# Patient Record
Sex: Male | Born: 1970 | Race: Black or African American | Hispanic: No | Marital: Married | Smoking: Former smoker
Health system: Southern US, Community
[De-identification: ages and names within clinical notes are randomized; demographics above are authoritative.]

---

## 2006-09-07 ENCOUNTER — Emergency Department: Payer: Self-pay | Admitting: Internal Medicine

## 2008-09-13 ENCOUNTER — Inpatient Hospital Stay: Payer: Self-pay | Admitting: Internal Medicine

## 2010-06-15 ENCOUNTER — Emergency Department: Payer: Self-pay | Admitting: Emergency Medicine

## 2019-04-17 ENCOUNTER — Ambulatory Visit: Payer: Self-pay | Attending: Oncology

## 2019-04-17 DIAGNOSIS — Z23 Encounter for immunization: Secondary | ICD-10-CM

## 2019-04-17 NOTE — Progress Notes (Signed)
   Covid-19 Vaccination Clinic  Name:  Edward Dunlap    MRN: [id number] DOB: 08-23-1970  04/17/2019  [name] was observed post Covid-19 immunization for 15 minutes without incident. He was provided with Vaccine Information Sheet and instruction to access the V-Safe system.   [name] was instructed to call 911 with any severe reactions post vaccine: Marland Kitchen Difficulty breathing  . Swelling of face and throat  . A fast heartbeat  . A bad rash all over body  . Dizziness and weakness   Immunizations Administered    Name Date Dose VIS Date Route   Pfizer COVID-19 Vaccine 04/17/2019  9:32 AM 0.3 mL 12/18/2018 Intramuscular   Manufacturer: [employer], [employer]   Lot: [id number]   NDC: [address]-2

## 2019-05-12 ENCOUNTER — Ambulatory Visit: Payer: Self-pay | Attending: Internal Medicine

## 2019-05-12 DIAGNOSIS — Z23 Encounter for immunization: Secondary | ICD-10-CM

## 2019-05-12 NOTE — Progress Notes (Signed)
   Covid-19 Vaccination Clinic  Name:  SOLOMON SKOWRONEK    MRN: [id number] DOB: August 07, 1970  05/12/2019  Mr. Orosz was observed post Covid-19 immunization for 15 minutes without incident. He was provided with Vaccine Information Sheet and instruction to access the V-Safe system.   Mr. Amores was instructed to call 911 with any severe reactions post vaccine: Marland Kitchen Difficulty breathing  . Swelling of face and throat  . A fast heartbeat  . A bad rash all over body  . Dizziness and weakness   Immunizations Administered    Name Date Dose VIS Date Route   Pfizer COVID-19 Vaccine 05/12/2019  9:42 AM 0.3 mL 03/03/2018 Intramuscular   Manufacturer: [employer], [employer]   Lot: [id number]   NDC: [address]-9

## 2019-11-29 ENCOUNTER — Encounter: Payer: Self-pay | Admitting: Emergency Medicine

## 2019-11-29 ENCOUNTER — Emergency Department: Payer: Self-pay

## 2019-11-29 ENCOUNTER — Emergency Department
Admission: EM | Admit: 2019-11-29 | Discharge: 2019-11-29 | Disposition: A | Discharge status: Home / Self Care | Payer: Self-pay | Attending: Emergency Medicine | Admitting: Emergency Medicine

## 2019-11-29 ENCOUNTER — Other Ambulatory Visit: Payer: Self-pay

## 2019-11-29 DIAGNOSIS — M79644 Pain in right finger(s): Secondary | ICD-10-CM | POA: Insufficient documentation

## 2019-11-29 DIAGNOSIS — Y9389 Activity, other specified: Secondary | ICD-10-CM | POA: Insufficient documentation

## 2019-11-29 DIAGNOSIS — W298XXA Contact with other powered powered hand tools and household machinery, initial encounter: Secondary | ICD-10-CM | POA: Insufficient documentation

## 2019-11-29 DIAGNOSIS — M25641 Stiffness of right hand, not elsewhere classified: Secondary | ICD-10-CM

## 2019-11-29 DIAGNOSIS — Z87891 Personal history of nicotine dependence: Secondary | ICD-10-CM | POA: Insufficient documentation

## 2019-11-29 NOTE — ED Notes (Signed)
Conscious/alert male. Swollen Right thumb

## 2019-11-29 NOTE — Discharge Instructions (Addendum)
Please use ice, anti-inflammatories and Tylenol as needed for your right thumb pain.  You may also try topical Voltaren gel that is available over-the-counter.  Please use this at a time when you will not be touching other items or your face, such as just before bed.  Follow-up with the hand surgeon regarding any possible treatments or interventions available for your right thumb.

## 2019-11-29 NOTE — ED Triage Notes (Signed)
Pt via POV from home. Pt states approx a year and a half ago pt was reinstalling bathroom stalls when a drill accidentally cut his finger. Pt states he never got it x-rayed or checked. But since then swelling to the R thumb has gotten worse and occasionally there would be a throbbing pain. Pt is A&Ox4 and NAD.

## 2019-11-30 NOTE — ED Provider Notes (Signed)
[facility] Emergency Department Provider Note  ____________________________________________   First MD Initiated Contact with Patient 11/29/19 1357     (approximate)  I have reviewed the triage vital signs and the nursing notes.   HISTORY  Chief Complaint Finger Injury   HPI Edward Dunlap is a 49 y.o. male who presents to the emergency department for evaluation of right thumb pain.  The patient works [employer] regularly.  He states that roughly 18 months ago, he was helping to hold a screw for a coworker when the coworkers drill slipped and hit the palmar aspect of his right thumb near the IP joint.  The patient states that he did not seek treatment of any kind at that time.  He notes intermittent swelling to the IP joint and palmar aspect of the thumb that seems to be related to when he has been working more with his hands.  He is right-hand dominant.  He presents today because he feels the swelling is more than typical and he would like it drained.  He currently rates his pain a 10/10 located the palmar aspect of the IP joint.        No past medical history on file.  There are no problems to display for this patient.    Prior to Admission medications   Not on File    Allergies Patient has no known allergies.  No family history on file.  Social History Social History   Tobacco Use  . Smoking status: Former Smoker    Packs/day: 1.00  . Smokeless tobacco: Never Used  Vaping Use  . Vaping Use: Never used  Substance Use Topics  . Alcohol use: Yes  . Drug use: Yes    Types: Marijuana    Review of Systems Constitutional: No fever/chills Eyes: No visual changes. ENT: No sore throat. Cardiovascular: Denies chest pain. Respiratory: Denies shortness of breath. Gastrointestinal: No abdominal pain.  No nausea, no vomiting.  No diarrhea.  No constipation. Genitourinary: Negative for dysuria. Musculoskeletal:+ Right thumb pain Skin:  Negative for rash. Neurological: Negative for headaches, focal weakness or numbness.  ____________________________________________   PHYSICAL EXAM:  VITAL SIGNS: ED Triage Vitals  Enc Vitals Group     BP 11/29/19 1348 (!) 158/73     Pulse Rate 11/29/19 1348 (!) 55     Resp 11/29/19 1348 20     Temp 11/29/19 1348 99 F (37.2 C)     Temp Source 11/29/19 1348 Oral     SpO2 11/29/19 1348 100 %     Weight 11/29/19 1349 145 lb (65.8 kg)     Height 11/29/19 1349 5\' 11"  (1.803 m)     Head Circumference --      Peak Flow --      Pain Score 11/29/19 1349 10     Pain Loc --      Pain Edu? --      Excl. in GC? --    Constitutional: Alert and oriented. Well appearing and in no acute distress. Eyes: Conjunctivae are normal.  Head: Atraumatic. Nose: No congestion/rhinnorhea. Mouth/Throat: Mucous membranes are moist.   Neck: No stridor.   Cardiovascular: Normal rate, regular rhythm.  Good peripheral circulation. Respiratory: Normal respiratory effort.  No retractions.  Musculoskeletal: There is tenderness to palpation about the pad of the right thumb as well as palmar aspect of the IP joint.  There is minimal range of motion actively or passively available at the IP joint.  There is no  tenderness of the MCP joint, scaphoid or other carpal or metacarpal bones.  Patient has full range of motion of the right MCP as compared to left.  No erythema or rash present.  There is an enlargement of soft tissue of the palmar aspect of the IP as compared to contralateral side.  This is tender, however not fluctuant. Neurologic:  Normal speech and language. No gross focal neurologic deficits are appreciated. No gait instability. Skin:  Skin is warm, dry and intact. No rash noted. Psychiatric: Mood and affect are normal. Speech and behavior are normal.  ____________________________________________  RADIOLOGY I, Lucy Chris, personally viewed and evaluated these images (plain radiographs) as part of  my medical decision making, as well as reviewing the written report by the radiologist.  ED provider interpretation: X-ray of the right thumb reveals no abnormality.   Radiologist interpretation of ultrasound of the right thumb reveals soft tissue thickening without any fluid collection or suggestion of abscess.  ____________________________________________   INITIAL IMPRESSION / ASSESSMENT AND PLAN / ED COURSE  As part of my medical decision making, I reviewed the following data within the electronic MEDICAL RECORD NUMBER Nursing notes reviewed and incorporated and Radiograph reviewed        Patient is a 49 year old male who presents to the emergency department for evaluation of right thumb injury that occurred 18 months ago.  He reports intermittent swelling of the area that becomes more painful at times.  See HPI for further details.  On physical exam, the patient does have limited range of motion of the IP of the right thumb with associated soft tissue enlargement as compared to left.  It is not fluctuant and feels much like scar tissue over that joint which is probably the cause of his limited range of motion.  This is not an acute injury in nature however the patient is concerned about increased swelling and pain that prompted his coming to the ER today.  Ultrasound was completed to show no fluid collection or abscess.  At this time, there is no acute treatment that will likely be beneficial for his symptoms.  We will have the patient follow-up with hand surgery to determine if there are any nonsurgical or surgical interventions that may benefit him.  The patient is amenable with this plan and will follow up with hand surgery.  He will return to the emergency department if he experiences any acute worsening.      ____________________________________________   FINAL CLINICAL IMPRESSION(S) / ED DIAGNOSES  Final diagnoses:  Pain of right thumb  Decreased range of motion of right thumb      ED Discharge Orders    None      *Please note:  Edward Dunlap was evaluated in Emergency Department on 11/30/2019 for the symptoms described in the history of present illness. He was evaluated in the context of the global COVID-19 pandemic, which necessitated consideration that the patient might be at risk for infection with the SARS-CoV-2 virus that causes COVID-19. Institutional protocols and algorithms that pertain to the evaluation of patients at risk for COVID-19 are in a state of rapid change based on information released by regulatory bodies including the CDC and federal and state organizations. These policies and algorithms were followed during the patient's care in the ED.  Some ED evaluations and interventions may be delayed as a result of limited staffing during and the pandemic.*   Note:  This document was prepared using Dragon voice recognition software and may  include unintentional dictation errors.    Lucy Chris, PA 11/30/19 1620    Arnaldo Natal, MD 11/30/19 1757

## 2021-08-25 IMAGING — DX DG FINGER THUMB 2+V*R*
3 series · 3 of 3 positions shown · non-contrast
Comparison: None.

CLINICAL DATA: Pain and swelling following injury he several months
ago, initial encounter

EXAM:
RIGHT THUMB 2+V

[finger ap]
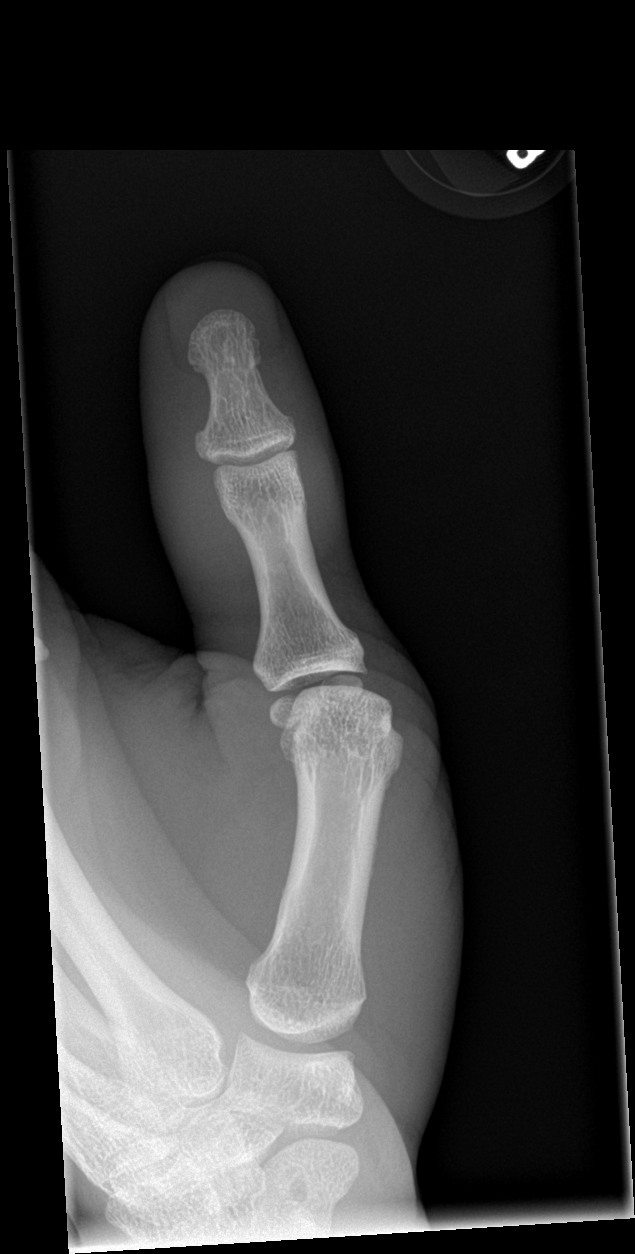

[finger obl]
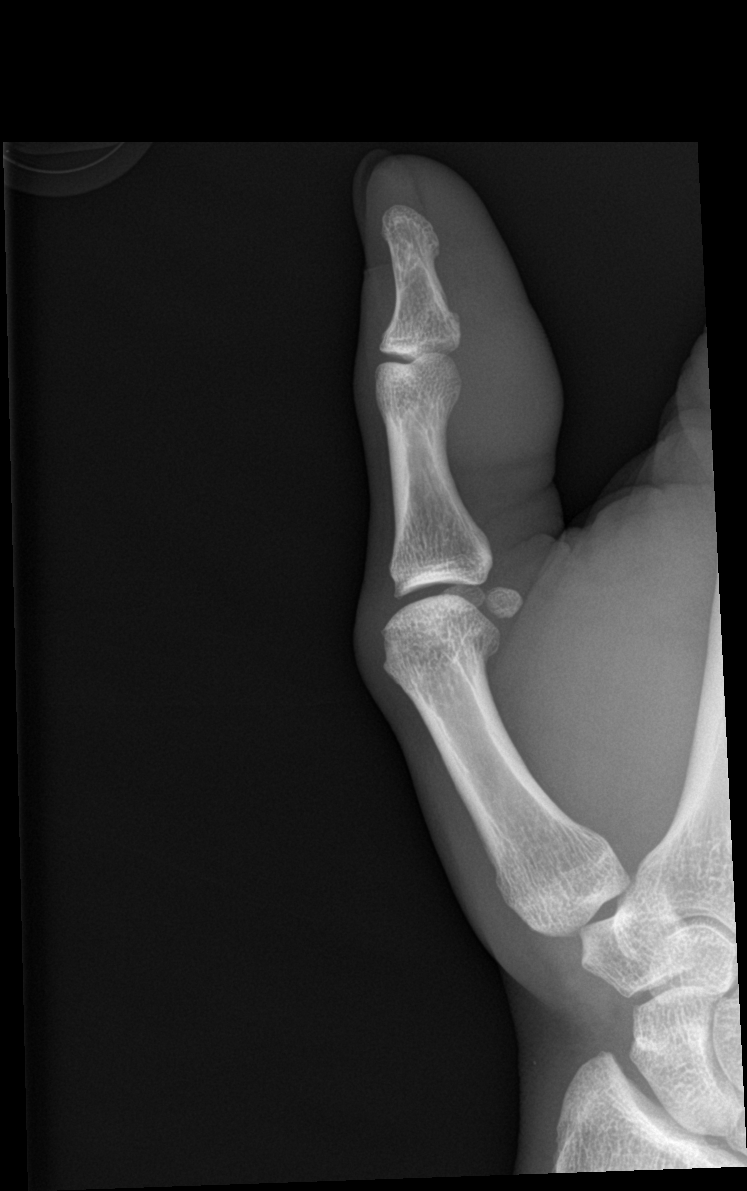

[finger lat]
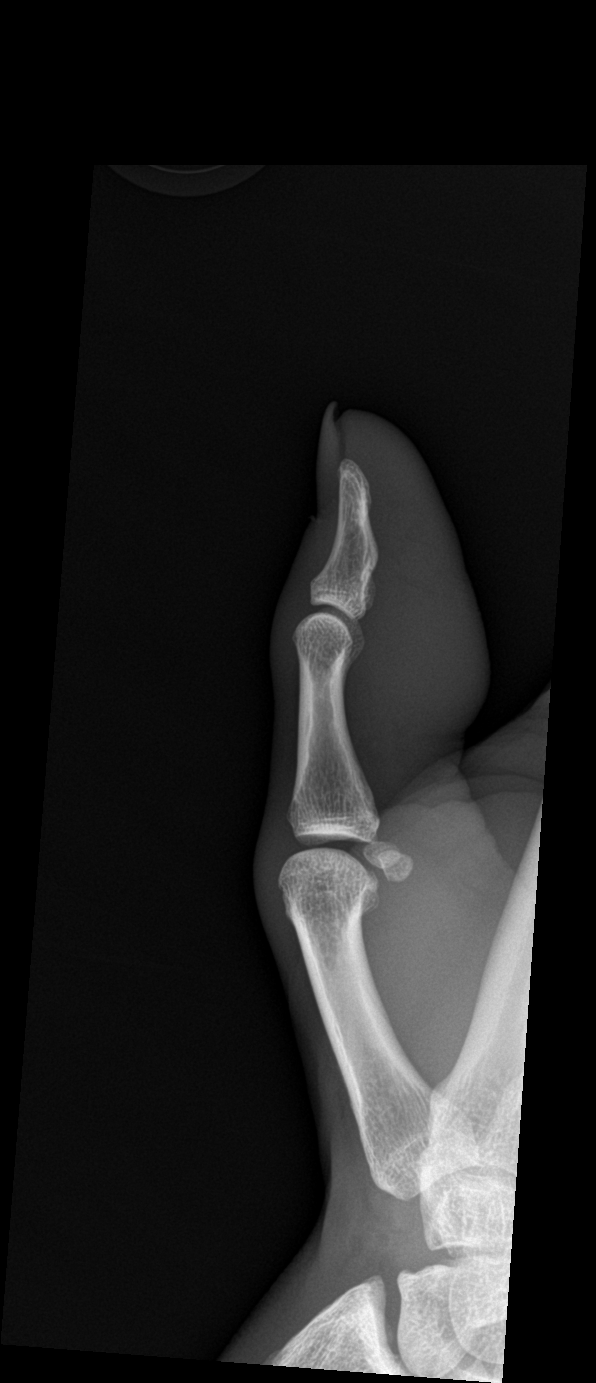

[3 of 3 positions shown; findings below may reference images not displayed]

FINDINGS: There is no evidence of fracture or dislocation. There is no
evidence of arthropathy or other focal bone abnormality. Soft
tissues are unremarkable. No radiopaque foreign body is noted.
IMPRESSION: No acute abnormality seen.
